# Patient Record
Sex: Female | Born: 1970 | Race: White | Hispanic: No | Marital: Married
Health system: Southern US, Community
[De-identification: ages and names within clinical notes are randomized; demographics above are authoritative.]

## PROBLEM LIST (undated history)

## (undated) DIAGNOSIS — D229 Melanocytic nevi, unspecified: Secondary | ICD-10-CM

## (undated) HISTORY — DX: Melanocytic nevi, unspecified: D22.9

---

## 2005-07-02 ENCOUNTER — Encounter: Admission: RE | Admit: 2005-07-02 | Discharge: 2005-07-02 | Payer: Self-pay | Admitting: Family Medicine

## 2005-10-12 ENCOUNTER — Other Ambulatory Visit: Admission: RE | Admit: 2005-10-12 | Discharge: 2005-10-12 | Payer: Self-pay | Admitting: Family Medicine

## 2006-10-15 ENCOUNTER — Other Ambulatory Visit: Admission: RE | Admit: 2006-10-15 | Discharge: 2006-10-15 | Payer: Self-pay | Admitting: Family Medicine

## 2006-11-19 ENCOUNTER — Emergency Department (HOSPITAL_COMMUNITY): Admission: EM | Admit: 2006-11-19 | Discharge: 2006-11-19 | Payer: Self-pay | Admitting: Emergency Medicine

## 2007-11-06 ENCOUNTER — Other Ambulatory Visit: Admission: RE | Admit: 2007-11-06 | Discharge: 2007-11-06 | Payer: Self-pay | Admitting: Family Medicine

## 2009-08-23 ENCOUNTER — Encounter: Admission: RE | Admit: 2009-08-23 | Discharge: 2009-08-23 | Payer: Self-pay | Admitting: Sports Medicine

## 2010-01-11 ENCOUNTER — Other Ambulatory Visit: Admission: RE | Admit: 2010-01-11 | Discharge: 2010-01-11 | Payer: Self-pay | Admitting: Obstetrics and Gynecology

## 2011-02-06 ENCOUNTER — Other Ambulatory Visit: Payer: Self-pay | Admitting: Nurse Practitioner

## 2011-02-06 DIAGNOSIS — Z1231 Encounter for screening mammogram for malignant neoplasm of breast: Secondary | ICD-10-CM

## 2011-02-23 ENCOUNTER — Ambulatory Visit: Payer: Self-pay

## 2011-03-19 ENCOUNTER — Ambulatory Visit
Admission: RE | Admit: 2011-03-19 | Discharge: 2011-03-19 | Disposition: A | Discharge status: Home / Self Care | Payer: BC Managed Care – PPO | Source: Ambulatory Visit | Attending: Nurse Practitioner | Admitting: Nurse Practitioner

## 2011-03-19 DIAGNOSIS — Z1231 Encounter for screening mammogram for malignant neoplasm of breast: Secondary | ICD-10-CM

## 2012-07-11 ENCOUNTER — Other Ambulatory Visit: Payer: Self-pay

## 2012-07-11 DIAGNOSIS — Z1231 Encounter for screening mammogram for malignant neoplasm of breast: Secondary | ICD-10-CM

## 2012-08-04 ENCOUNTER — Ambulatory Visit
Admission: RE | Admit: 2012-08-04 | Discharge: 2012-08-04 | Disposition: A | Discharge status: Home / Self Care | Payer: BC Managed Care – PPO | Source: Ambulatory Visit

## 2012-08-04 DIAGNOSIS — Z1231 Encounter for screening mammogram for malignant neoplasm of breast: Secondary | ICD-10-CM

## 2014-07-21 ENCOUNTER — Other Ambulatory Visit: Payer: Self-pay | Admitting: Nurse Practitioner

## 2014-07-21 DIAGNOSIS — Z1231 Encounter for screening mammogram for malignant neoplasm of breast: Secondary | ICD-10-CM

## 2014-08-04 ENCOUNTER — Ambulatory Visit
Admission: RE | Admit: 2014-08-04 | Discharge: 2014-08-04 | Disposition: A | Discharge status: Home / Self Care | Payer: BC Managed Care – PPO | Source: Ambulatory Visit | Attending: Nurse Practitioner | Admitting: Nurse Practitioner

## 2014-08-04 DIAGNOSIS — Z1231 Encounter for screening mammogram for malignant neoplasm of breast: Secondary | ICD-10-CM

## 2016-03-05 ENCOUNTER — Other Ambulatory Visit: Payer: Self-pay | Admitting: Physician Assistant

## 2016-03-05 DIAGNOSIS — Z1231 Encounter for screening mammogram for malignant neoplasm of breast: Secondary | ICD-10-CM

## 2016-04-13 ENCOUNTER — Ambulatory Visit
Admission: RE | Admit: 2016-04-13 | Discharge: 2016-04-13 | Disposition: A | Discharge status: Home / Self Care | Payer: BC Managed Care – PPO | Source: Ambulatory Visit | Attending: Physician Assistant | Admitting: Physician Assistant

## 2016-04-13 DIAGNOSIS — Z1231 Encounter for screening mammogram for malignant neoplasm of breast: Secondary | ICD-10-CM

## 2016-04-20 ENCOUNTER — Other Ambulatory Visit: Payer: Self-pay | Admitting: Physician Assistant

## 2016-04-20 DIAGNOSIS — R928 Other abnormal and inconclusive findings on diagnostic imaging of breast: Secondary | ICD-10-CM

## 2016-05-02 ENCOUNTER — Ambulatory Visit
Admission: RE | Admit: 2016-05-02 | Discharge: 2016-05-02 | Disposition: A | Discharge status: Home / Self Care | Payer: BC Managed Care – PPO | Source: Ambulatory Visit | Attending: Physician Assistant | Admitting: Physician Assistant

## 2016-05-02 ENCOUNTER — Other Ambulatory Visit: Payer: Self-pay | Admitting: Physician Assistant

## 2016-05-02 DIAGNOSIS — R928 Other abnormal and inconclusive findings on diagnostic imaging of breast: Secondary | ICD-10-CM

## 2016-05-02 DIAGNOSIS — N6320 Unspecified lump in the left breast, unspecified quadrant: Secondary | ICD-10-CM

## 2016-05-03 ENCOUNTER — Other Ambulatory Visit: Payer: Self-pay | Admitting: Physician Assistant

## 2016-05-03 DIAGNOSIS — N6320 Unspecified lump in the left breast, unspecified quadrant: Secondary | ICD-10-CM

## 2016-05-04 ENCOUNTER — Ambulatory Visit
Admission: RE | Admit: 2016-05-04 | Discharge: 2016-05-04 | Disposition: A | Discharge status: Home / Self Care | Payer: BC Managed Care – PPO | Source: Ambulatory Visit | Attending: Physician Assistant | Admitting: Physician Assistant

## 2016-05-04 DIAGNOSIS — N6320 Unspecified lump in the left breast, unspecified quadrant: Secondary | ICD-10-CM

## 2016-05-04 HISTORY — PX: BREAST BIOPSY: SHX20

## 2017-06-18 ENCOUNTER — Other Ambulatory Visit: Payer: Self-pay | Admitting: Physician Assistant

## 2017-06-18 DIAGNOSIS — Z1231 Encounter for screening mammogram for malignant neoplasm of breast: Secondary | ICD-10-CM

## 2017-07-08 ENCOUNTER — Ambulatory Visit
Admission: RE | Admit: 2017-07-08 | Discharge: 2017-07-08 | Disposition: A | Discharge status: Home / Self Care | Payer: BC Managed Care – PPO | Source: Ambulatory Visit | Attending: Physician Assistant | Admitting: Physician Assistant

## 2017-07-08 DIAGNOSIS — Z1231 Encounter for screening mammogram for malignant neoplasm of breast: Secondary | ICD-10-CM

## 2018-07-17 ENCOUNTER — Encounter: Payer: Self-pay | Admitting: *Deleted

## 2018-10-06 ENCOUNTER — Other Ambulatory Visit: Payer: Self-pay | Admitting: Physician Assistant

## 2018-10-06 DIAGNOSIS — Z1231 Encounter for screening mammogram for malignant neoplasm of breast: Secondary | ICD-10-CM

## 2019-01-07 ENCOUNTER — Ambulatory Visit
Admission: RE | Admit: 2019-01-07 | Discharge: 2019-01-07 | Disposition: A | Discharge status: Home / Self Care | Payer: BC Managed Care – PPO | Source: Ambulatory Visit | Attending: Physician Assistant | Admitting: Physician Assistant

## 2019-01-07 ENCOUNTER — Other Ambulatory Visit: Payer: Self-pay

## 2019-01-07 DIAGNOSIS — Z1231 Encounter for screening mammogram for malignant neoplasm of breast: Secondary | ICD-10-CM

## 2020-04-20 ENCOUNTER — Other Ambulatory Visit: Payer: Self-pay | Admitting: Physician Assistant

## 2020-04-20 DIAGNOSIS — Z0000 Encounter for general adult medical examination without abnormal findings: Secondary | ICD-10-CM

## 2020-06-03 ENCOUNTER — Ambulatory Visit
Admission: RE | Admit: 2020-06-03 | Discharge: 2020-06-03 | Disposition: A | Discharge status: Home / Self Care | Payer: No Typology Code available for payment source | Source: Ambulatory Visit | Attending: Physician Assistant | Admitting: Physician Assistant

## 2020-06-03 ENCOUNTER — Other Ambulatory Visit: Payer: Self-pay

## 2020-06-03 DIAGNOSIS — Z0000 Encounter for general adult medical examination without abnormal findings: Secondary | ICD-10-CM

## 2021-07-06 ENCOUNTER — Ambulatory Visit: Payer: No Typology Code available for payment source | Admitting: Physician Assistant

## 2021-07-13 ENCOUNTER — Other Ambulatory Visit: Payer: Self-pay | Admitting: Physician Assistant

## 2021-07-13 DIAGNOSIS — Z1231 Encounter for screening mammogram for malignant neoplasm of breast: Secondary | ICD-10-CM

## 2021-07-18 ENCOUNTER — Ambulatory Visit
Admission: RE | Admit: 2021-07-18 | Discharge: 2021-07-18 | Disposition: A | Discharge status: Home / Self Care | Payer: No Typology Code available for payment source | Source: Ambulatory Visit | Attending: Physician Assistant | Admitting: Physician Assistant

## 2021-07-18 DIAGNOSIS — Z1231 Encounter for screening mammogram for malignant neoplasm of breast: Secondary | ICD-10-CM

## 2021-09-05 ENCOUNTER — Encounter: Payer: Self-pay | Admitting: Physician Assistant

## 2021-09-05 ENCOUNTER — Ambulatory Visit: Payer: No Typology Code available for payment source | Admitting: Physician Assistant

## 2021-09-05 DIAGNOSIS — Z86018 Personal history of other benign neoplasm: Secondary | ICD-10-CM | POA: Diagnosis not present

## 2021-09-05 DIAGNOSIS — D2261 Melanocytic nevi of right upper limb, including shoulder: Secondary | ICD-10-CM | POA: Diagnosis not present

## 2021-09-05 DIAGNOSIS — Z1283 Encounter for screening for malignant neoplasm of skin: Secondary | ICD-10-CM

## 2021-09-05 DIAGNOSIS — D485 Neoplasm of uncertain behavior of skin: Secondary | ICD-10-CM

## 2021-09-05 DIAGNOSIS — L820 Inflamed seborrheic keratosis: Secondary | ICD-10-CM | POA: Diagnosis not present

## 2021-09-05 NOTE — Patient Instructions (Signed)

## 2021-09-05 NOTE — Progress Notes (Signed)
? ?  New Patient ?  ?Subjective  ?Sheila Shaffer is a 51 y.o. female who presents for the following: Annual Exam (Here for skin exam. Concerns right hand patient picks it. Patient has splotchy skin on her face. Wants to know if there is anything to help the discoloration. No history of skin cancer. History of atypical mole. ). ? ? ?The following portions of the chart were reviewed this encounter and updated as appropriate:  Tobacco  Allergies  Meds  Problems  Med Hx  Surg Hx  Fam Hx  Soc  ?Hx  ?  ? ?Objective  ?Well appearing patient in no apparent distress; mood and affect are within normal limits. ? ?A full examination was performed including scalp, head, eyes, ears, nose, lips, neck, chest, axillae, abdomen, back, buttocks, bilateral upper extremities, bilateral lower extremities, hands, feet, fingers, toes, fingernails, and toenails. All findings within normal limits unless otherwise noted below. ? ?Right Upper Arm - Posterior ?Bichromic dark nested macule.  ? ? ? ? ?Left Buccal Cheek (4), Right Malar Cheek (5) ?Crust with pink base ? ? ?Assessment & Plan  ?Neoplasm of uncertain behavior of skin ?Right Upper Arm - Posterior ? ?Skin / nail biopsy ?Type of biopsy: tangential   ?Informed consent: discussed and consent obtained   ?Timeout: patient name, date of birth, surgical site, and procedure verified   ?Anesthesia: the lesion was anesthetized in a standard fashion   ?Anesthetic:  1% lidocaine w/ epinephrine 1-100,000 local infiltration ?Instrument used: flexible razor blade   ?Hemostasis achieved with: aluminum chloride and electrodesiccation   ?Outcome: patient tolerated procedure well   ?Post-procedure details: wound care instructions given   ? ?Specimen 1 - Surgical pathology ?Differential Diagnosis: atypia ? ?Check Margins: yes  ? ?Inflamed seborrheic keratosis (9) ?Right Malar Cheek (5); Left Buccal Cheek (4) ? ?Destruction of lesion - Left Buccal Cheek, Right Malar Cheek ?Complexity: simple    ?Destruction method: cryotherapy   ?Informed consent: discussed and consent obtained   ?Timeout:  patient name, date of birth, surgical site, and procedure verified ?Lesion destroyed using liquid nitrogen: Yes   ?Cryotherapy cycles:  1 ?Outcome: patient tolerated procedure well with no complications   ?Post-procedure details: wound care instructions given   ? ? ? ? ?I, Braelee Herrle, PA-C, have reviewed all documentation's for this visit.  The documentation on 09/05/21 for the exam, diagnosis, procedures and orders are all accurate and complete. ?

## 2021-09-11 ENCOUNTER — Telehealth: Payer: Self-pay | Admitting: *Deleted

## 2021-09-11 NOTE — Telephone Encounter (Signed)
-----   Message from Warren Danes, [place] sent at 09/11/2021 11:13 AM EDT ----- ws

## 2021-09-11 NOTE — Telephone Encounter (Signed)
She's called back for results

## 2021-09-11 NOTE — Telephone Encounter (Signed)
Left patient a message to call back for pathology report.  

## 2021-09-11 NOTE — Telephone Encounter (Signed)
Path to patient. WS scheduled.

## 2021-09-12 ENCOUNTER — Encounter: Payer: Self-pay | Admitting: Physician Assistant

## 2021-12-19 ENCOUNTER — Encounter: Payer: No Typology Code available for payment source | Admitting: Physician Assistant

## 2022-07-30 ENCOUNTER — Other Ambulatory Visit: Payer: Self-pay | Admitting: Nurse Practitioner

## 2022-07-30 DIAGNOSIS — Z1231 Encounter for screening mammogram for malignant neoplasm of breast: Secondary | ICD-10-CM

## 2022-09-11 ENCOUNTER — Ambulatory Visit
Admission: RE | Admit: 2022-09-11 | Discharge: 2022-09-11 | Disposition: A | Discharge status: Home / Self Care | Payer: No Typology Code available for payment source | Source: Ambulatory Visit | Attending: Nurse Practitioner | Admitting: Nurse Practitioner

## 2022-09-11 DIAGNOSIS — Z1231 Encounter for screening mammogram for malignant neoplasm of breast: Secondary | ICD-10-CM

## 2023-06-11 IMAGING — MG MM DIGITAL SCREENING BILAT W/ TOMO AND CAD
8 series · 8 of 24 positions shown · non-contrast
Comparison: Previous exam(s).

ACR Breast Density Category a: The breast tissue is almost entirely
fatty.

CLINICAL DATA: Screening.

EXAM:
DIGITAL SCREENING BILATERAL MAMMOGRAM WITH TOMOSYNTHESIS AND CAD
TECHNIQUE: Bilateral screening digital craniocaudal and mediolateral oblique
mammograms were obtained. Bilateral screening digital breast
tomosynthesis was performed. The images were evaluated with
computer-aided detection.

[L MLO synth-2D]
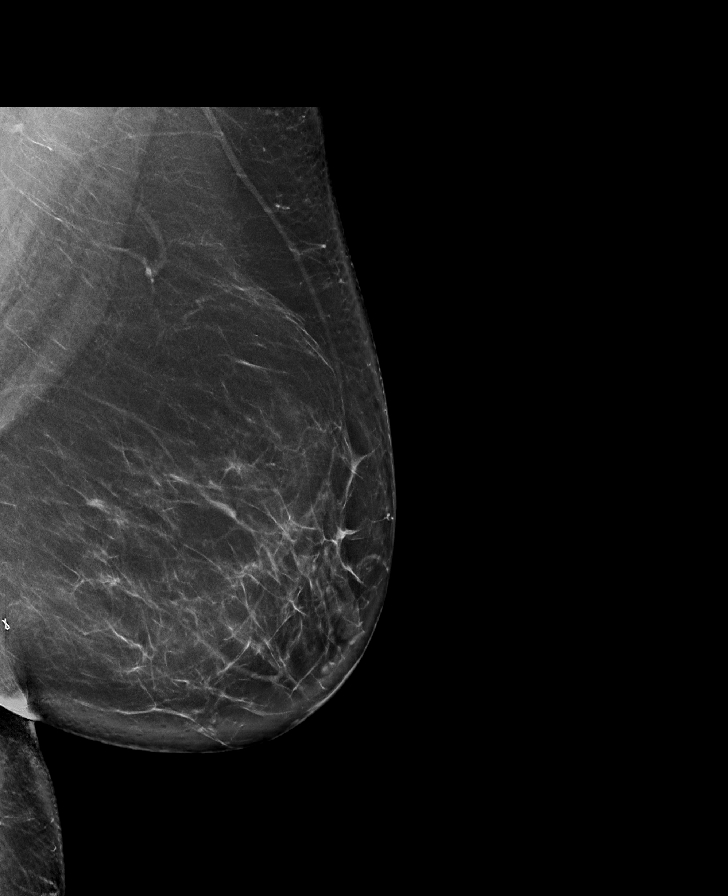

[R MLO synth-2D]
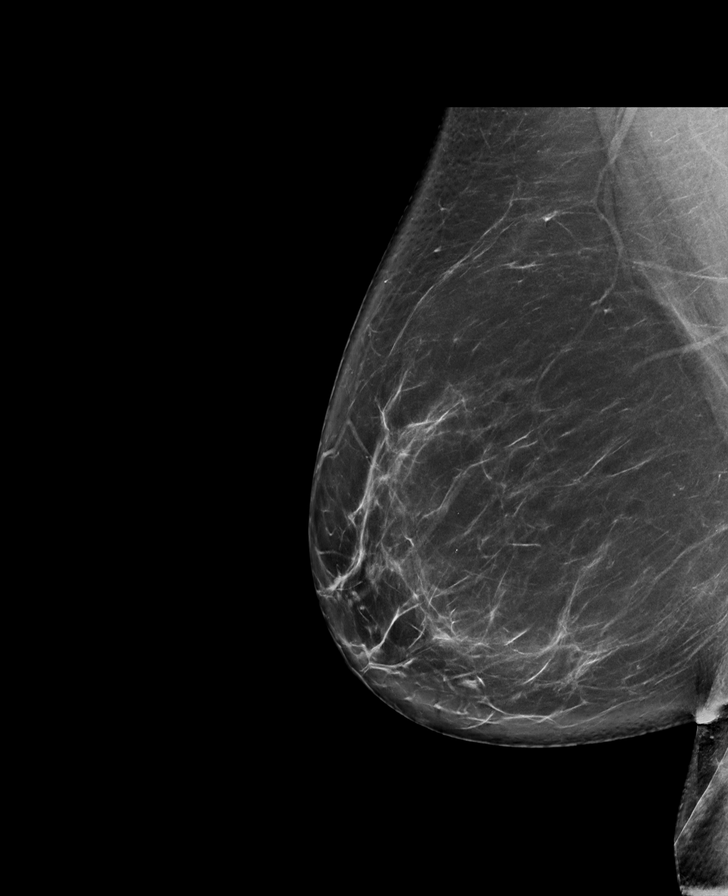

[L CC synth-2D]
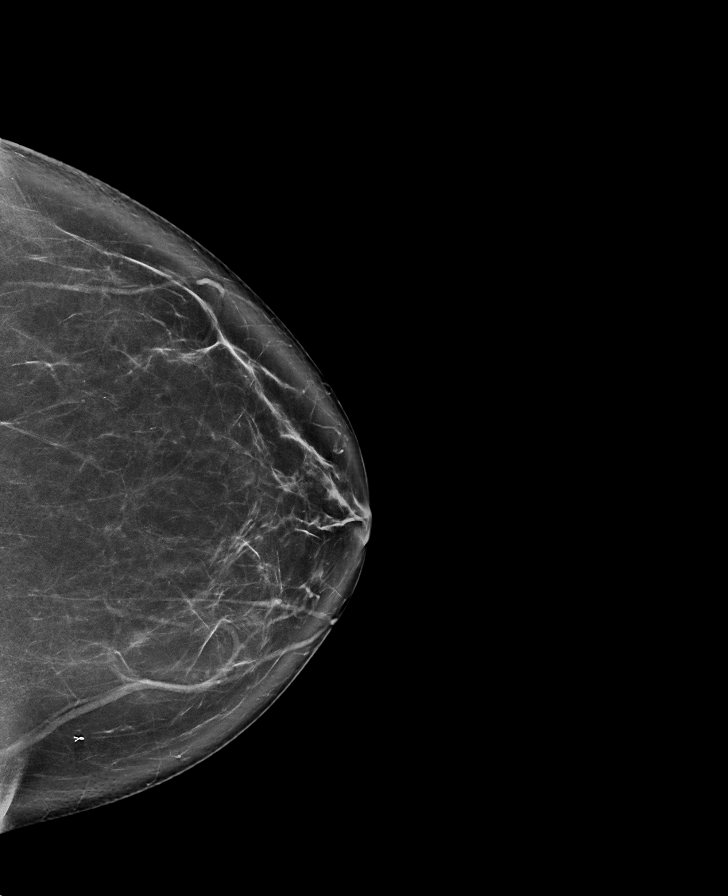

[R CC synth-2D]
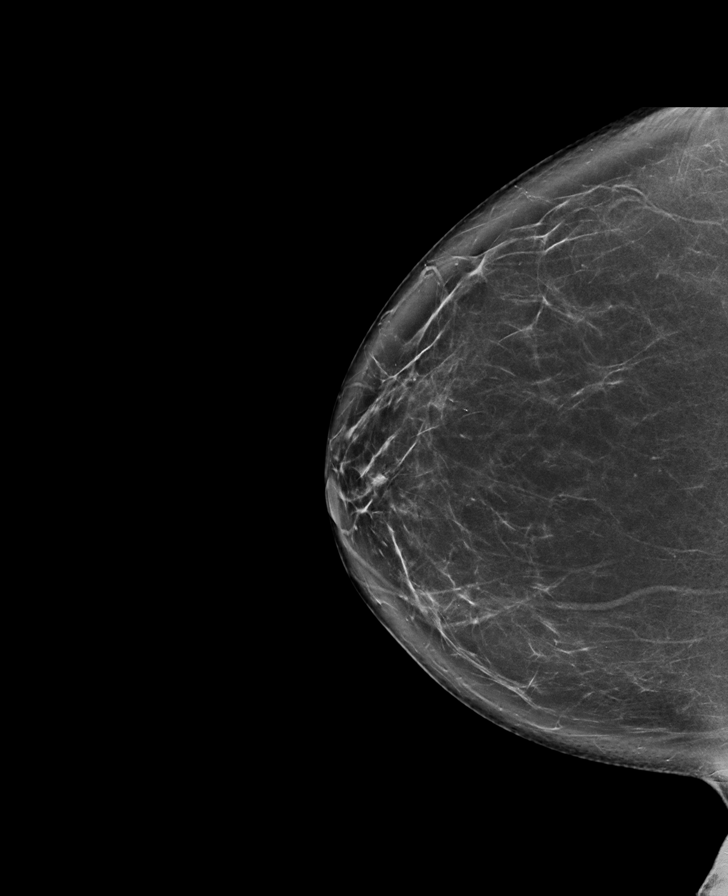

[L CC tomo · tomo slice 45/90.0]
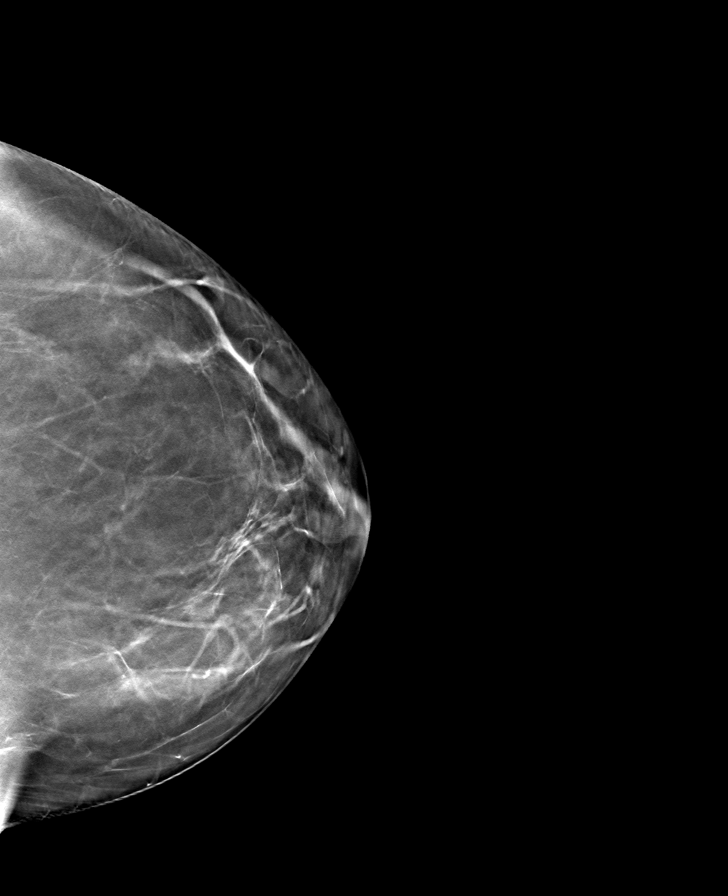

[R MLO tomo · tomo slice 49/96.0]
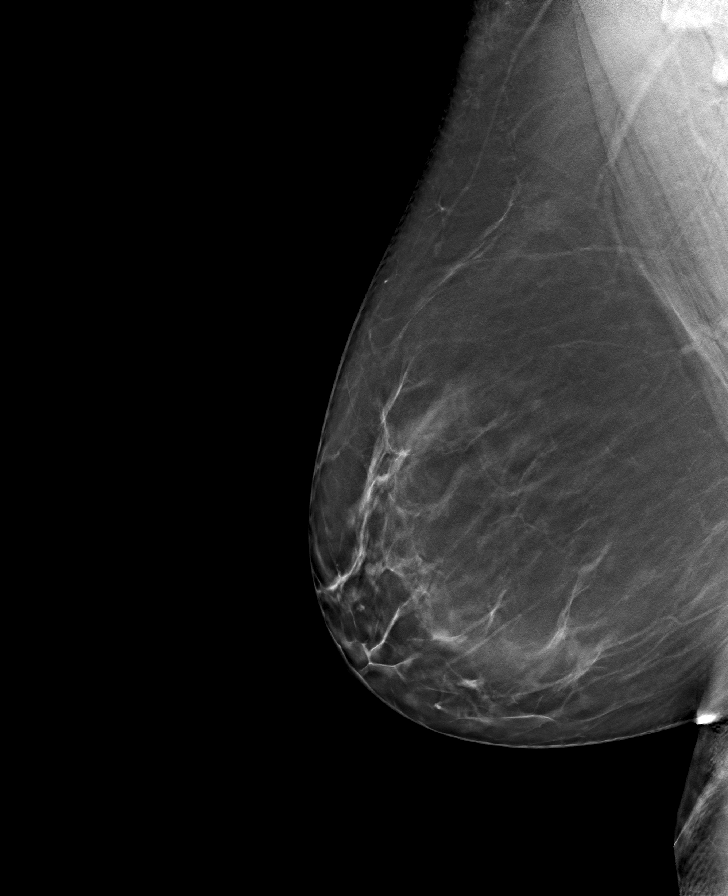

[R CC tomo · tomo slice 46/91.0]
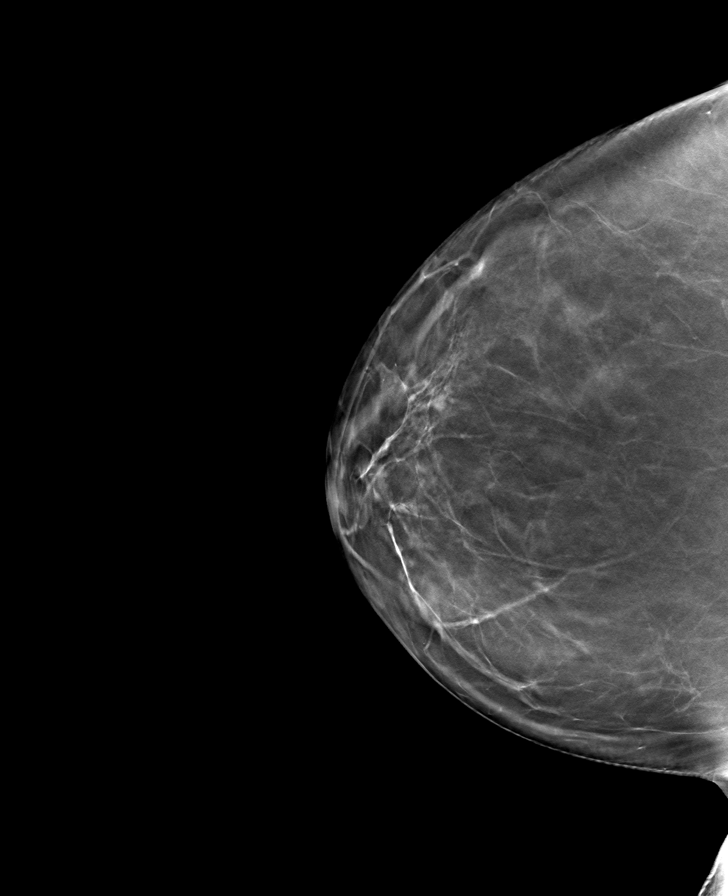

[L MLO tomo · tomo slice 49/97.0]
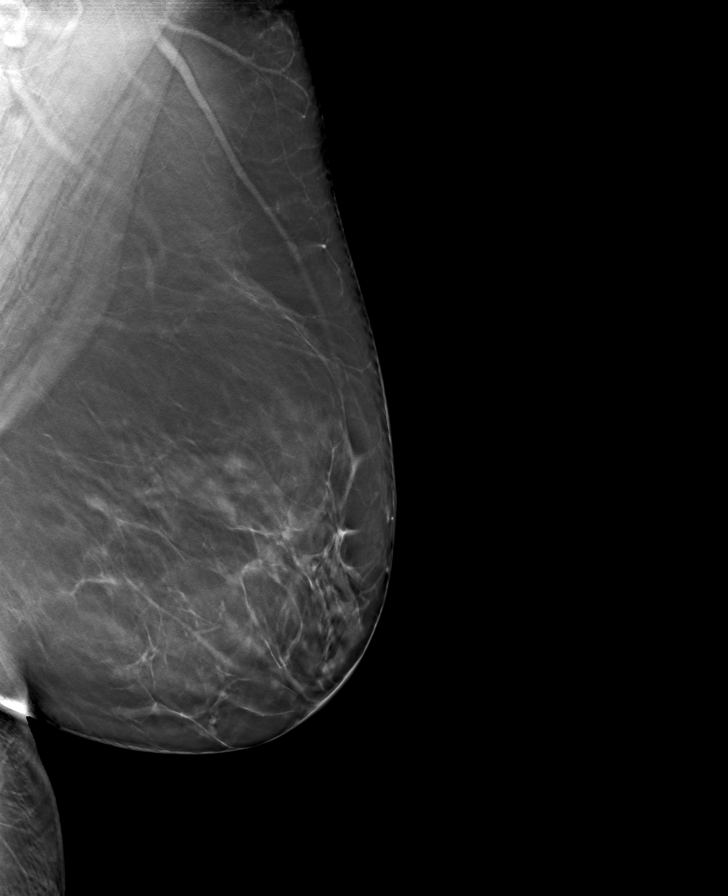

[8 of 24 positions shown; findings below may reference images not displayed]

FINDINGS: There are no findings suspicious for malignancy.
IMPRESSION: No mammographic evidence of malignancy. A result letter of this
screening mammogram will be mailed directly to the patient.

RECOMMENDATION:
Screening mammogram in one year. (Code:0E-3-N98)

BI-RADS CATEGORY  1: Negative.

## 2023-09-05 ENCOUNTER — Other Ambulatory Visit: Payer: Self-pay | Admitting: Nurse Practitioner

## 2023-09-05 DIAGNOSIS — Z1231 Encounter for screening mammogram for malignant neoplasm of breast: Secondary | ICD-10-CM

## 2023-09-18 ENCOUNTER — Ambulatory Visit

## 2023-10-08 ENCOUNTER — Ambulatory Visit
Admission: RE | Admit: 2023-10-08 | Discharge: 2023-10-08 | Disposition: A | Discharge status: Home / Self Care | Source: Ambulatory Visit | Attending: Nurse Practitioner | Admitting: Nurse Practitioner

## 2023-10-08 DIAGNOSIS — Z1231 Encounter for screening mammogram for malignant neoplasm of breast: Secondary | ICD-10-CM
